# Patient Record
Sex: Male | Born: 1970 | Race: White | Hispanic: No | Marital: Single | Smoking: Current every day smoker
Health system: Southern US, Community
[De-identification: ages and names within clinical notes are randomized; demographics above are authoritative.]

---

## 2018-05-11 ENCOUNTER — Emergency Department: Payer: PRIVATE HEALTH INSURANCE

## 2018-05-11 ENCOUNTER — Encounter: Payer: Self-pay | Admitting: Emergency Medicine

## 2018-05-11 ENCOUNTER — Other Ambulatory Visit: Payer: Self-pay

## 2018-05-11 ENCOUNTER — Emergency Department
Admission: EM | Admit: 2018-05-11 | Discharge: 2018-05-11 | Disposition: A | Discharge status: Home / Self Care | Payer: PRIVATE HEALTH INSURANCE | Attending: Emergency Medicine | Admitting: Emergency Medicine

## 2018-05-11 DIAGNOSIS — S299XXA Unspecified injury of thorax, initial encounter: Secondary | ICD-10-CM | POA: Diagnosis present

## 2018-05-11 DIAGNOSIS — F17210 Nicotine dependence, cigarettes, uncomplicated: Secondary | ICD-10-CM | POA: Diagnosis not present

## 2018-05-11 DIAGNOSIS — S2241XA Multiple fractures of ribs, right side, initial encounter for closed fracture: Secondary | ICD-10-CM

## 2018-05-11 DIAGNOSIS — Y9389 Activity, other specified: Secondary | ICD-10-CM | POA: Insufficient documentation

## 2018-05-11 DIAGNOSIS — Y9259 Other trade areas as the place of occurrence of the external cause: Secondary | ICD-10-CM | POA: Insufficient documentation

## 2018-05-11 DIAGNOSIS — Y999 Unspecified external cause status: Secondary | ICD-10-CM | POA: Insufficient documentation

## 2018-05-11 DIAGNOSIS — W11XXXA Fall on and from ladder, initial encounter: Secondary | ICD-10-CM | POA: Insufficient documentation

## 2018-05-11 MED ORDER — NAPROXEN 500 MG PO TABS: 500.0000 mg | ORAL_TABLET | Freq: Two times a day (BID) | ORAL | 0 refills | Status: DC

## 2018-05-11 NOTE — ED Notes (Addendum)
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  Company not found in [employer].

## 2018-05-11 NOTE — ED Provider Notes (Signed)
[facility] Emergency Department Provider Note  ____________________________________________   First MD Initiated Contact with Patient 05/11/18 [phone]     (approximate)  I have reviewed the triage vital signs and the nursing notes.   HISTORY  Chief Complaint Rib Pain (Work Comp)   HPI James Petersen is a 47 y.o. male is here with complaint of right rib pain.  Patient states that he slipped while at work on a 6 foot ladder and landed on the concrete.  Patient states that he also injured his hand at that time but that has completely healed.  He continues to have pain in his right ribs that is not improved with over-the-counter medication.  This happened approximately 3 weeks ago.  This is the initial medical visit for this.  Patient is unsure if this will be counted as Workmen's Comp. even though it happened at work he has not seen his employer's since the accident.  He states that they are aware that this happened.  He denies any head injury or loss of consciousness.  He rates his pain as an 8 out of 10.   History reviewed. No pertinent past medical history.  There are no active problems to display for this patient.   History reviewed. No pertinent surgical history.  Prior to Admission medications   Medication Sig Start Date End Date Taking? Authorizing Provider  naproxen (NAPROSYN) 500 MG tablet Take 1 tablet (500 mg total) by mouth 2 (two) times daily with a meal. 05/11/18   Johnn Hai, PA-C    Allergies Patient has no known allergies.  No family history on file.  Social History Social History   Tobacco Use  . Smoking status: Current Every Day Smoker    Types: Cigarettes  . Smokeless tobacco: Never Used  Substance Use Topics  . Alcohol use: Not on file  . Drug use: Not on file    Review of Systems Constitutional: No fever/chills Cardiovascular: Denies chest pain. Respiratory: Denies shortness of breath. Gastrointestinal: No abdominal  pain.  No nausea, no vomiting. Musculoskeletal: Positive for right rib pain. Skin: Negative for rash. Neurological: Negative for headaches, focal weakness or numbness. ____________________________________________   PHYSICAL EXAM:  VITAL SIGNS: ED Triage Vitals  Enc Vitals Group     BP 05/11/18 0754 116/69     Pulse Rate 05/11/18 0754 70     Resp 05/11/18 0754 16     Temp 05/11/18 0754 98.3 F (36.8 C)     Temp Source 05/11/18 0754 Oral     SpO2 05/11/18 0754 99 %     Weight 05/11/18 0753 170 lb (77.1 kg)     Height 05/11/18 0753 5\' 10"  (1.778 m)     Head Circumference --      Peak Flow --      Pain Score 05/11/18 0752 8     Pain Loc --      Pain Edu? --      Excl. in [place]? --    Constitutional: Alert and oriented. Well appearing and in no acute distress. Eyes: Conjunctivae are normal.  Head: Atraumatic. Neck: No stridor.   Cardiovascular: Normal rate, regular rhythm. Grossly normal heart sounds.  Good peripheral circulation. Respiratory: Normal respiratory effort.  No retractions. Lungs CTAB.  No gross deformities noted of the ribs however there is marked tenderness on palpation of the right lateral aspect at approximately 7 and 8.  No soft tissue swelling or ecchymosis is present. Gastrointestinal: Soft and nontender. No distention.  No CVA tenderness. Musculoskeletal: Moves upper and lower extremities with any difficulty.  Normal gait was noted. Neurologic:  Normal speech and language. No gross focal neurologic deficits are appreciated.  Skin:  Skin is warm, dry and intact.  No ecchymosis or abrasions were seen. Psychiatric: Mood and affect are normal. Speech and behavior are normal.  ____________________________________________   LABS (all labs ordered are listed, but only abnormal results are displayed)  Labs Reviewed - No data to display   RADIOLOGY   Official radiology report(s): Dg Ribs Unilateral W/chest Right  Result Date: 05/11/2018 CLINICAL DATA:  Fall  from ladder 1 month ago with persistent right-sided chest pain, initial encounter EXAM: RIGHT RIBS AND CHEST - 3+ VIEW COMPARISON:  None. FINDINGS: Cardiac shadow is within normal limits. The lungs are well aerated bilaterally. No focal infiltrate or effusion is seen. No pneumothorax is noted. Some increased sclerosis is noted in the anterior aspect of the fifth through seventh ribs on the right consistent with healing fractures. A lateral eighth rib fracture with healing is noted as well. No other rib fractures are noted. IMPRESSION: Multiple healing rib fractures are noted on the right. No pneumothorax is noted. Electronically Signed   By: Inez Catalina M.D.   On: 05/11/2018 08:49  ____________________________________________   PROCEDURES  Procedure(s) performed: None  Procedures  Critical Care performed: No  ____________________________________________   INITIAL IMPRESSION / ASSESSMENT AND PLAN / ED COURSE  As part of my medical decision making, I reviewed the following data within the electronic MEDICAL RECORD NUMBER Notes from prior ED visits and [place] Controlled Substance Database  Patient was made aware that he does have rib fractures on the right.  These appear to be healing well per radiology report.  Patient was given a list of restrictions to take to work.  Patient was given naproxen 500 mg twice daily with food as this is already 25 weeks old.  He is to follow-up with [facility] acute care or Dr. of his companies choice.  ____________________________________________   FINAL CLINICAL IMPRESSION(S) / ED DIAGNOSES  Final diagnoses:  Fracture of ribs, three, closed, right, initial encounter     ED Discharge Orders        Ordered    naproxen (NAPROSYN) 500 MG tablet  2 times daily with meals,   Status:  Discontinued     05/11/18 0906    naproxen (NAPROSYN) 500 MG tablet  2 times daily with meals     05/11/18 0911       Note:  This document was prepared using Dragon voice  recognition software and may include unintentional dictation errors.    Johnn Hai, PA-C 05/11/18 1546    Delman Kitten, MD 05/11/18 [phone]

## 2018-05-11 NOTE — Discharge Instructions (Signed)
Follow-up with [facility] acute care or doctor  of your companies choice if any continued problems.  Begin taking naproxen 500 mg twice daily with food.  Decrease the amount of smoking that you are doing as this increases your chances for pneumonia with fractured ribs.

## 2018-05-11 NOTE — ED Triage Notes (Signed)
Slipped while on a 6 ft ladder, fell, landed on right hand and stretched / injured right ribs.  Patient states injury occurred 3 weeks ago and pain seems worse in the mornings.

## 2018-05-11 NOTE — ED Notes (Signed)
Pt c/o right side rib pain for the past few weeks related to work injury. Pt has not been seen for this injury before. Pt states the pain is worse in the mornings and has tried tylenol and ibuprofen with minimal relief.  Pt states the pain has not worsened but is not any better.

## 2018-06-13 ENCOUNTER — Emergency Department: Payer: Worker's Compensation

## 2018-06-13 ENCOUNTER — Encounter: Payer: Self-pay | Admitting: Emergency Medicine

## 2018-06-13 ENCOUNTER — Other Ambulatory Visit: Payer: Self-pay

## 2018-06-13 ENCOUNTER — Emergency Department
Admission: EM | Admit: 2018-06-13 | Discharge: 2018-06-13 | Disposition: A | Discharge status: Home / Self Care | Payer: Worker's Compensation | Attending: Emergency Medicine | Admitting: Emergency Medicine

## 2018-06-13 DIAGNOSIS — Y929 Unspecified place or not applicable: Secondary | ICD-10-CM | POA: Insufficient documentation

## 2018-06-13 DIAGNOSIS — D3501 Benign neoplasm of right adrenal gland: Secondary | ICD-10-CM | POA: Insufficient documentation

## 2018-06-13 DIAGNOSIS — W1789XA Other fall from one level to another, initial encounter: Secondary | ICD-10-CM | POA: Insufficient documentation

## 2018-06-13 DIAGNOSIS — Y990 Civilian activity done for income or pay: Secondary | ICD-10-CM | POA: Insufficient documentation

## 2018-06-13 DIAGNOSIS — R319 Hematuria, unspecified: Secondary | ICD-10-CM

## 2018-06-13 DIAGNOSIS — F17210 Nicotine dependence, cigarettes, uncomplicated: Secondary | ICD-10-CM | POA: Insufficient documentation

## 2018-06-13 DIAGNOSIS — Y939 Activity, unspecified: Secondary | ICD-10-CM | POA: Insufficient documentation

## 2018-06-13 DIAGNOSIS — D3502 Benign neoplasm of left adrenal gland: Secondary | ICD-10-CM

## 2018-06-13 DIAGNOSIS — S2231XA Fracture of one rib, right side, initial encounter for closed fracture: Secondary | ICD-10-CM | POA: Insufficient documentation

## 2018-06-13 LAB — CBC WITH DIFFERENTIAL/PLATELET
BASOS PCT: 1 %
Basophils Absolute: 0.1 10*3/uL (ref 0–0.1)
EOS ABS: 0.1 10*3/uL (ref 0–0.7)
EOS PCT: 2 %
HCT: 38.2 % — ABNORMAL LOW (ref 40.0–52.0)
HEMOGLOBIN: 13.6 g/dL (ref 13.0–18.0)
LYMPHS ABS: 1.5 10*3/uL (ref 1.0–3.6)
Lymphocytes Relative: 22 %
MCH: 33.3 pg (ref 26.0–34.0)
MCHC: 35.4 g/dL (ref 32.0–36.0)
MCV: 94 fL (ref 80.0–100.0)
MONO ABS: 0.7 10*3/uL (ref 0.2–1.0)
Monocytes Relative: 10 %
Neutro Abs: 4.4 10*3/uL (ref 1.4–6.5)
Neutrophils Relative %: 65 %
Platelets: 288 10*3/uL (ref 150–440)
RBC: 4.07 MIL/uL — ABNORMAL LOW (ref 4.40–5.90)
RDW: 13.3 % (ref 11.5–14.5)
WBC: 6.7 10*3/uL (ref 3.8–10.6)

## 2018-06-13 LAB — URINALYSIS, COMPLETE (UACMP) WITH MICROSCOPIC
Bacteria, UA: NONE SEEN
GLUCOSE, UA: NEGATIVE mg/dL
KETONES UR: 5 mg/dL — AB
LEUKOCYTES UA: NEGATIVE
NITRITE: NEGATIVE
PH: 5 (ref 5.0–8.0)
Protein, ur: 30 mg/dL — AB
SPECIFIC GRAVITY, URINE: 1.04 — AB (ref 1.005–1.030)
SQUAMOUS EPITHELIAL / LPF: NONE SEEN (ref 0–5)

## 2018-06-13 LAB — COMPREHENSIVE METABOLIC PANEL
ALBUMIN: 4.1 g/dL (ref 3.5–5.0)
ALK PHOS: 76 U/L (ref 38–126)
ALT: 23 U/L (ref 0–44)
ANION GAP: 5 (ref 5–15)
AST: 30 U/L (ref 15–41)
BILIRUBIN TOTAL: 0.6 mg/dL (ref 0.3–1.2)
BUN: 17 mg/dL (ref 6–20)
CALCIUM: 9 mg/dL (ref 8.9–10.3)
CO2: 25 mmol/L (ref 22–32)
Chloride: 107 mmol/L (ref 98–111)
Creatinine, Ser: 0.86 mg/dL (ref 0.61–1.24)
GFR calc Af Amer: >60 mL/min (ref >60)
GFR calc non Af Amer: >60 mL/min (ref >60)
Glucose, Bld: 94 mg/dL (ref 70–99)
POTASSIUM: 4.3 mmol/L (ref 3.5–5.1)
Sodium: 137 mmol/L (ref 135–145)
TOTAL PROTEIN: 6.8 g/dL (ref 6.5–8.1)

## 2018-06-13 MED ORDER — HYDROCODONE-ACETAMINOPHEN 5-325 MG PO TABS: 1.0000 | ORAL_TABLET | ORAL | 0 refills | Status: AC | PRN

## 2018-06-13 MED ORDER — IOPAMIDOL (ISOVUE-300) INJECTION 61%
100.0000 mL | Freq: Once | INTRAVENOUS | Status: AC | PRN
  Administered 2018-06-13: 100 mL via INTRAVENOUS
  Filled 2018-06-13: qty 100

## 2018-06-13 MED ORDER — HYDROCODONE-ACETAMINOPHEN 5-325 MG PO TABS
1.0000 | ORAL_TABLET | Freq: Once | ORAL | Status: AC
Start: 2018-06-13 — End: 2018-06-13
  Administered 2018-06-13: 1 via ORAL
  Filled 2018-06-13: qty 1

## 2018-06-13 NOTE — ED Notes (Signed)
Workman's comp urine drug screen completed by this tech and specimen bag taken to [facility] lab to await transport to [facility].

## 2018-06-13 NOTE — ED Provider Notes (Signed)
[facility] Emergency Department Provider Note  ____________________________________________   None    (approximate)  I have reviewed the triage vital signs and the nursing notes.   HISTORY  Chief Complaint Fall    HPI James Petersen is a 47 y.o. male presents to the ED with complaint of right lower rib cage pain.  Patient states that he fell off a scaffold approximately 8 feet and landed on his right side while at work 1 week ago.  He has taken "muscle relaxants belonging to mother" and Tylenol without any relief.  He states this morning that the pain increased.  He denies any head injury or loss of consciousness.  This is an initial visit for this injury as he has not seen any medical provider until today.  Patient also was seen on 05/11/2018 for fractured ribs that he sustained also at work from a fall.  Patient denies any known hematuria but states that his urine has been darker than usual.  He denies any nausea, vomiting, dizziness, headache or visual changes.  Currently rates his pain as a 10/10.  History reviewed. No pertinent past medical history.  There are no active problems to display for this patient.   History reviewed. No pertinent surgical history.  Prior to Admission medications   Medication Sig Start Date End Date Taking? Authorizing Provider  HYDROcodone-acetaminophen (NORCO/VICODIN) 5-325 MG tablet Take 1 tablet by mouth every 4 (four) hours as needed for moderate pain. 06/13/18   Johnn Hai, PA-C    Allergies Patient has no known allergies.  History reviewed. No pertinent family history.  Social History Social History   Tobacco Use  . Smoking status: Current Every Day Smoker    Packs/day: 1.00    Types: Cigarettes  . Smokeless tobacco: Never Used  Substance Use Topics  . Alcohol use: Not on file    Comment: occasional  . Drug use: Never    Review of Systems Constitutional: No fever/chills Eyes: No visual  changes. ENT: No trauma. Cardiovascular: Denies chest pain. Respiratory: Denies shortness of breath. Gastrointestinal: No abdominal pain.  No nausea, no vomiting.  No diarrhea.  No constipation. Genitourinary: Negative for dysuria.  Positive "dark urine". Musculoskeletal: Negative for cervical, thoracic or lumbar  pain.  Positive for right rib pain. Skin: Negative for rash. Neurological: Negative for headaches, focal weakness or numbness. ____________________________________________   PHYSICAL EXAM:  VITAL SIGNS: ED Triage Vitals  Enc Vitals Group     BP 06/13/18 0726 135/73     Pulse Rate 06/13/18 0726 95     Resp 06/13/18 0726 18     Temp 06/13/18 0726 98.5 F (36.9 C)     Temp Source 06/13/18 0726 Oral     SpO2 06/13/18 0726 98 %     Weight 06/13/18 0727 170 lb (77.1 kg)     Height 06/13/18 0727 5\' 10"  (1.778 m)     Head Circumference --      Peak Flow --      Pain Score 06/13/18 0726 10     Pain Loc --      Pain Edu? --      Excl. in [place]? --    Constitutional: Alert and oriented. Well appearing and in no acute distress. Eyes: Conjunctivae are normal. PERRL. EOMI. Head: Atraumatic. Nose: No trauma. Neck: No stridor.  No tenderness on palpation cervical spine posteriorly.  Range of motion is that restriction. Cardiovascular: Normal rate, regular rhythm. Grossly normal heart sounds.  Good  peripheral circulation. Respiratory: Normal respiratory effort.  No retractions.  Moderate tenderness on palpation of the right lateral ribs.  Lungs CTAB.  Minimal soft tissue swelling present.  No ecchymosis or abrasions were seen. Gastrointestinal: Soft and nontender. No distention.  No CVA tenderness.  Bowel sounds are normoactive x4 quadrants.   Musculoskeletal: No tenderness on palpation of the thoracic or lumbar spine.  Patient is able to move upper and lower extremities without any difficulty.  Good muscle strength bilaterally.  Patient is ambulatory without assistance. Neurologic:   Normal speech and language. No gross focal neurologic deficits are appreciated.  Reflexes 2+ bilaterally.  No gait instability. Skin:  Skin is warm, dry and intact. No rash noted. Psychiatric: Mood and affect are normal. Speech and behavior are normal.  ____________________________________________   LABS (all labs ordered are listed, but only abnormal results are displayed)  Labs Reviewed  URINALYSIS, COMPLETE (UACMP) WITH MICROSCOPIC - Abnormal; Notable for the following components:      Result Value   Color, Urine AMBER (*)    APPearance CLEAR (*)    Specific Gravity, Urine 1.040 (*)    Hgb urine dipstick MODERATE (*)    Bilirubin Urine SMALL (*)    Ketones, ur 5 (*)    Protein, ur 30 (*)    RBC / HPF >50 (*)    All other components within normal limits  CBC WITH DIFFERENTIAL/PLATELET - Abnormal; Notable for the following components:   RBC 4.07 (*)    HCT 38.2 (*)    All other components within normal limits  COMPREHENSIVE METABOLIC PANEL    RADIOLOGY  ED MD interpretation:   Right rib detail is positive for fracture acute and healing fractures as noted on radiologist report.  Official radiology report(s): Dg Ribs Unilateral W/chest Right  Result Date: 06/13/2018 CLINICAL DATA:  Pt states he fell from a scaffolding last Friday and hit his right rib area; States he has had a previous injury to 3 ribs to same side from August; States this pain is lower; pain is right anterior; BB marks area of interest EXAM: RIGHT RIBS AND CHEST - 3+ VIEW COMPARISON:  05/11/2018 FINDINGS: Healing right rib fractures as before, involving anterolateral aspect of ribs 5, 6, 7, 8. There is a fracture of the anterolateral aspect of right ninth rib without significant callus, suggesting this is more acute. No pneumothorax. No pleural effusion. IMPRESSION: 1. Right ninth rib fracture.  No pneumothorax. 2. Healing fractures of rib right ribs 5-8 as before. Electronically Signed   By: Lucrezia Europe M.D.   On:  06/13/2018 09:18   Ct Abdomen Pelvis W Contrast  Result Date: 06/13/2018 CLINICAL DATA:  Scaffolding injury 1 wk ago. Fall from about 8 feet. C/o pain to right upper abdomen area. Hx rt rib fx in July. States microscopic hematuria in ER today. Denies surgical hx. EXAM: CT ABDOMEN AND PELVIS WITH CONTRAST TECHNIQUE: Multidetector CT imaging of the abdomen and pelvis was performed using the standard protocol following bolus administration of intravenous contrast. CONTRAST:  170mL ISOVUE-300 IOPAMIDOL (ISOVUE-300) INJECTION 61% COMPARISON:  Rib radiographs, 05/11/2018 FINDINGS: Lower chest: Clear lung bases.  Heart normal in size. Hepatobiliary: Liver normal in size and attenuation. No mass or contusion. No laceration. Normal gallbladder. No bile duct dilation. Pancreas: No contusion or laceration.  No mass or inflammation. Spleen: No contusion or laceration. Normal in size and attenuation. No masses. Adrenals/Urinary Tract: Bilateral low-attenuation renal masses with significant relative washout consistent with adenomas, 19 mm on  the right and 16 mm on the left. More diffuse right adrenal gland thickening consistent with hyperplasia. No renal contusion or laceration. No renal masses, stones or hydronephrosis. Symmetric renal enhancement and excretion. Normal ureters. Normal bladder. Stomach/Bowel: No evidence of bowel injury. Stomach is unremarkable. Normal small bowel. Mild to moderate increased stool burden in the colon. No colonic wall thickening or inflammation. Normal appendix visualized. Vascular/Lymphatic: No significant vascular findings are present. No enlarged abdominal or pelvic lymph nodes. Reproductive: Unremarkable. Other: No abdominal wall contusion. No hernia. No ascites or hemoperitoneum. Musculoskeletal: Healing fractures of the lateral seventh and eighth right ribs. No acute fractures. No osteoblastic or osteolytic lesions. IMPRESSION: 1. No acute injury to the abdomen or pelvis. 2. Healing right  seventh and eighth rib fractures. No new fractures. 3. Mild to moderate increased stool burden in the colon. 4. 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 Electronically Signed   By: Lajean Manes M.D.   On: 06/13/2018 12:04  ____________________________________________   PROCEDURES  Procedure(s) performed: None  Procedures  Critical Care performed: No  ____________________________________________   INITIAL IMPRESSION / ASSESSMENT AND PLAN / ED COURSE  As part of my medical decision making, I reviewed the following data within the electronic MEDICAL RECORD NUMBER Notes from prior ED visits and [place] Controlled Substance Database  47 year old male presents to the emergency department with complaint of right rib pain after falling approximately 8 feet off a scalpel while at work 1 week ago.  Patient also has history of prior rib fractures on 05/11/2018.  Patient denies any head injury or loss of consciousness.  This is the initial visit for this accident.  X-rays revealed healing fractures of the seventh and eighth ribs along with what appears to be a new fracture of the ninth rib.  Incidental finding was bilateral adrenal adenomas and right adrenal hyperplasia.  Patient was given Norco while in the emergency department.  He was also given a prescription for the same as needed for pain.  He states his tetanus is up-to-date.  Patient is to follow-up with his companies doctor or [facility] acute care if any continued problems.  Workmen's Comp. papers were filed and patient complied with drug screen as documented by the company that he works for.  Patient is also was given a urologist to follow-up with for his adrenal gland adenoma which is not related to his Workmen's Comp. injury. ____________________________________________   FINAL CLINICAL IMPRESSION(S) / ED DIAGNOSES  Final diagnoses:  Closed fracture of one rib of right side, initial encounter  Hematuria, unspecified type    Fall from height of greater than 3 feet  Adenoma of left adrenal gland  Adenoma of right adrenal gland     ED Discharge Orders         Ordered    HYDROcodone-acetaminophen (NORCO/VICODIN) 5-325 MG tablet  Every 4 hours PRN     06/13/18 1237           Note:  This document was prepared using Dragon voice recognition software and may include unintentional dictation errors.    Johnn Hai, PA-C 06/13/18 1439    Lavonia Drafts, MD 06/13/18 [phone]

## 2018-06-13 NOTE — ED Triage Notes (Signed)
Pt presents via pov from home after falling last Friday from scaffolding; states he has had increasing pain since then in his lower right ribcage. Pt reports that he fell approximately 8 feet onto his right side. Pt states he had prior rib fracture in July but this pain is lower in the ribcage. Pt alert & oriented with nad noted.

## 2018-06-13 NOTE — Discharge Instructions (Signed)
Follow-up with [facility] or your companies Workmen's Comp. doctor for follow-up of your rib fracture.  Norco 1 every 4 hours as needed for moderate pain.  Take this medication with food.  Do not drive or operate machinery while taking this medication.  Ice to your ribs as needed for discomfort. You also need to follow-up with a urologist.  The urologist on call today is Dr. Gloriann Loan and his contact information is listed on your discharge papers.  This is in reference to your CT findings of an adrenal gland adenoma.  Avoid taking ibuprofen or anti-inflammatories at this time.  Return to the ED over the weekend if any severe worsening of your symptoms.

## 2018-06-13 NOTE — ED Notes (Signed)
See triage note  States he fell from a scaffolding last Friday  States he hit his right rib area  States he has had a previous injury to 3 ribs to same side  States this pain is lower down

## 2018-06-13 NOTE — ED Notes (Signed)
Pts supervisor's name is "Merrilee Seashore" and number is: [phone]. [employer] called and voicemail was left for supervisor.

## 2018-06-13 NOTE — ED Notes (Signed)
Pts supervisor returned call and requested urine drug screen. Supervisor reported that he will be sending someone to pick up employers copy of workman's comp chain of custody form.

## 2020-05-04 IMAGING — CR DG RIBS W/ CHEST 3+V*R*
1 series · 3 of 3 positions shown · non-contrast
Comparison: None.

CLINICAL DATA: Fall from ladder 1 month ago with persistent
right-sided chest pain, initial encounter

EXAM:
RIGHT RIBS AND CHEST - 3+ VIEW

[Series 1: dg ribs unilateral w/chest right · 0.14mm/px · 3 of 3 slices shown]
[im 1/3]
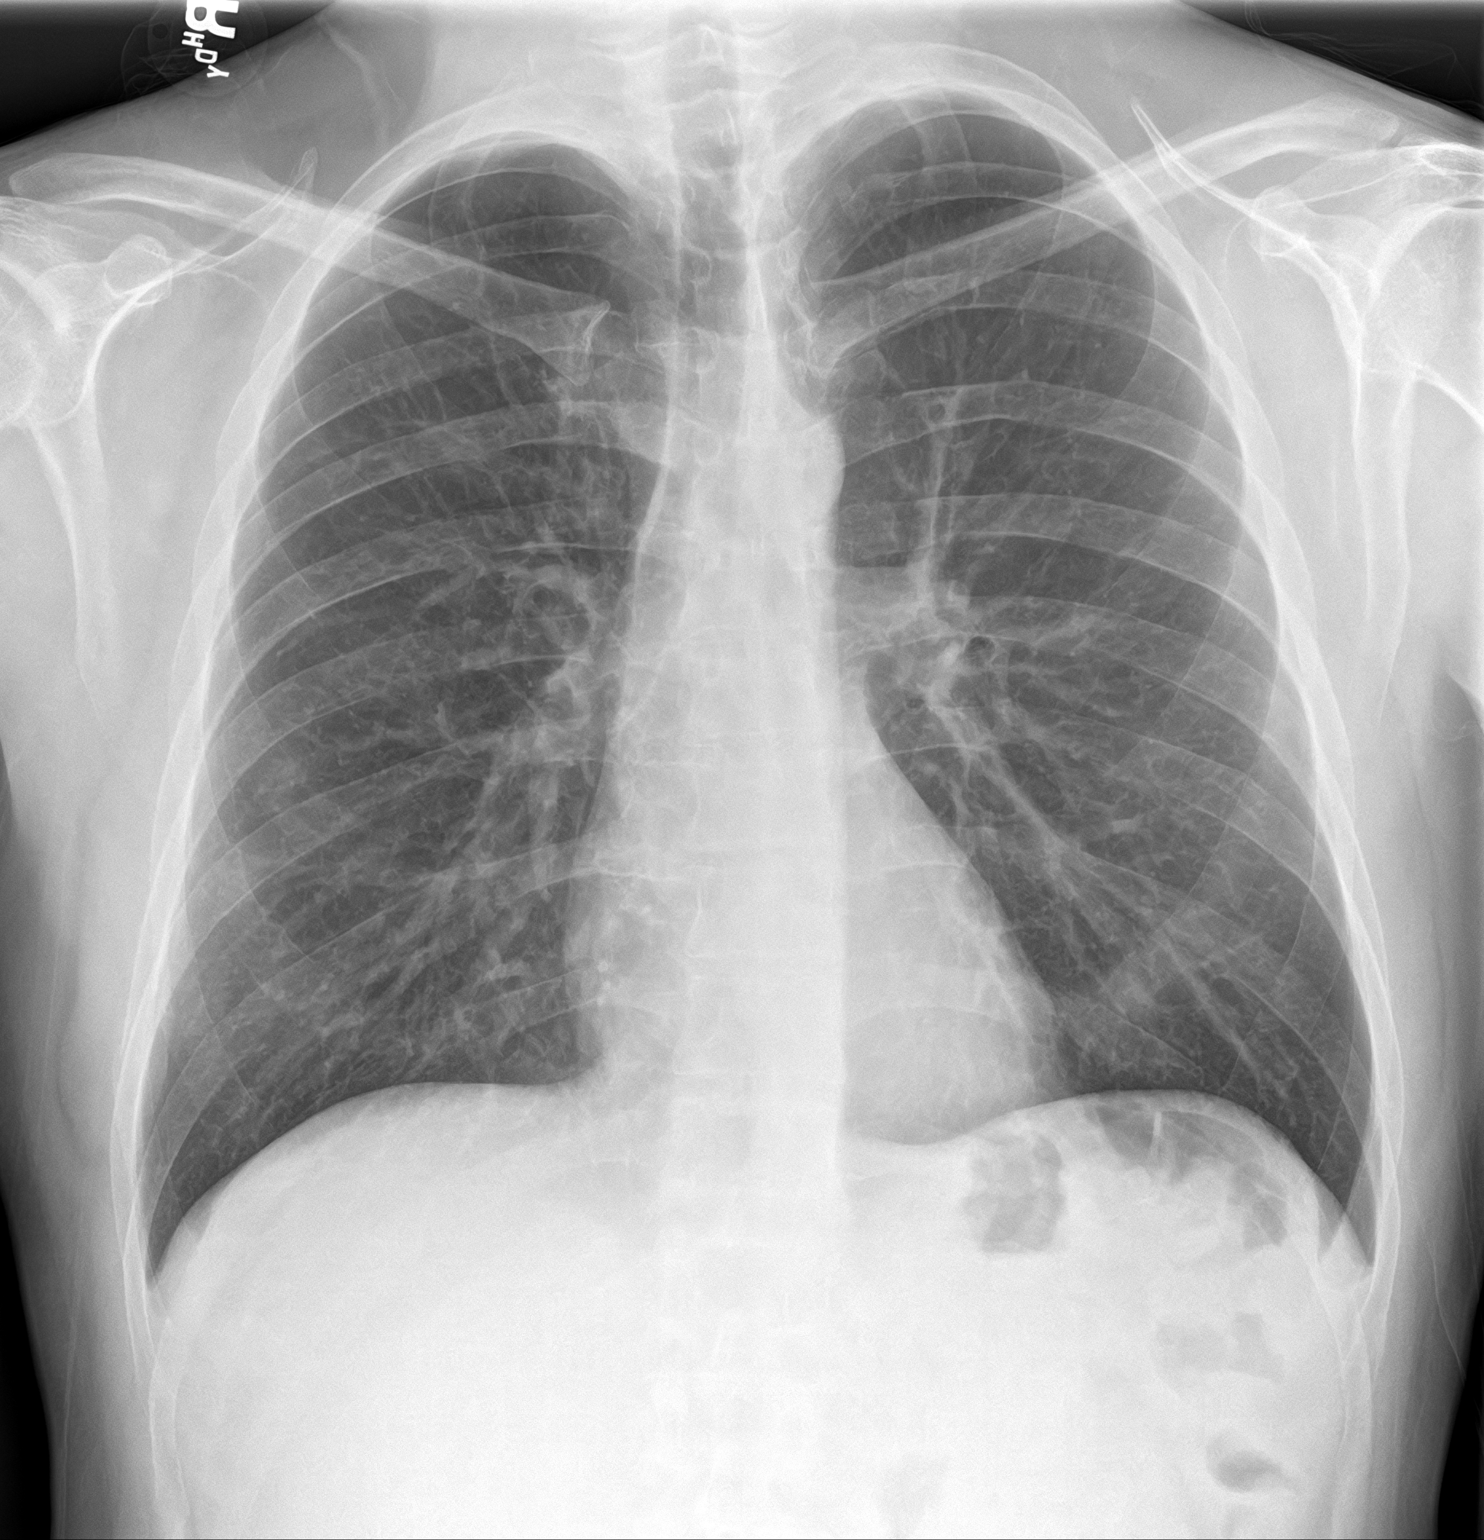
[im 2/3]
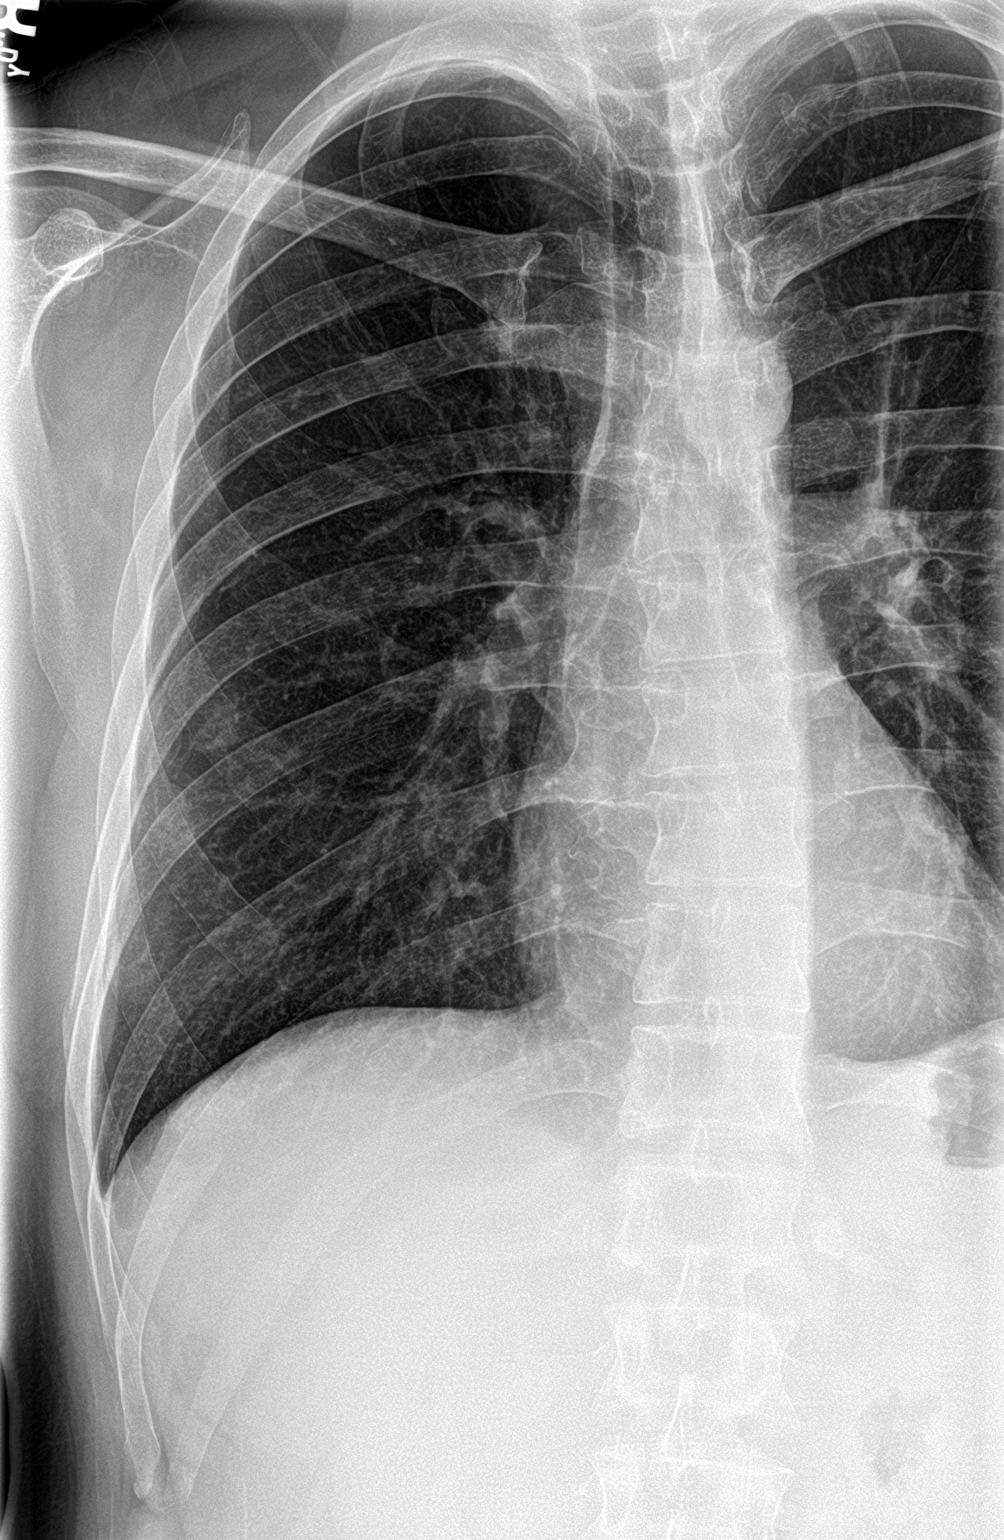
[im 3/3]
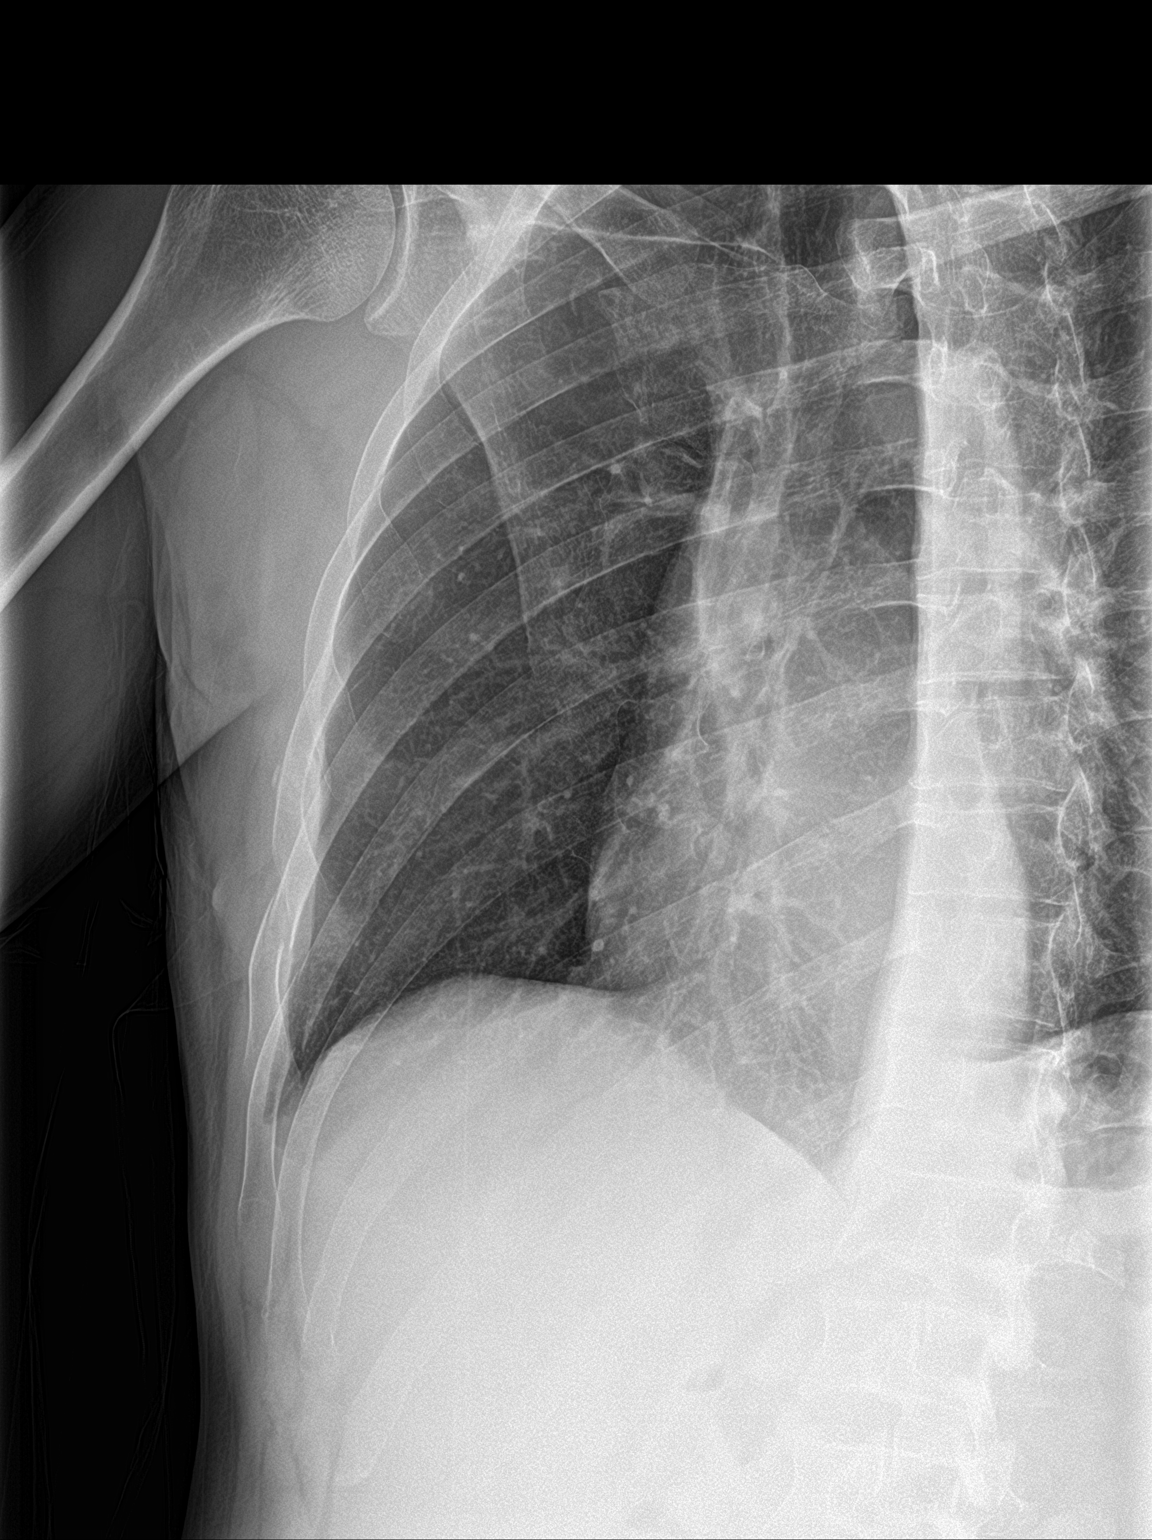

[3 of 3 positions shown; findings below may reference images not displayed]

FINDINGS: Cardiac shadow is within normal limits. The lungs are well aerated
bilaterally. No focal infiltrate or effusion is seen. No
pneumothorax is noted. Some increased sclerosis is noted in the
anterior aspect of the fifth through seventh ribs on the right
consistent with healing fractures. A lateral eighth rib fracture
with healing is noted as well. No other rib fractures are noted.
IMPRESSION: Multiple healing rib fractures are noted on the right. No
pneumothorax is noted.

## 2020-06-06 IMAGING — CR DG RIBS W/ CHEST 3+V*R*
5 series · 5 of 5 positions shown · non-contrast
Comparison: 05/11/2018

CLINICAL DATA: Pt states he fell from a scaffolding [REDACTED] and
hit his right rib area; States he has had a previous injury to 3
ribs to same side from [place]; States this pain is lower; pain is
right anterior; BB marks area of interest

EXAM:
RIGHT RIBS AND CHEST - 3+ VIEW

[chest pa]
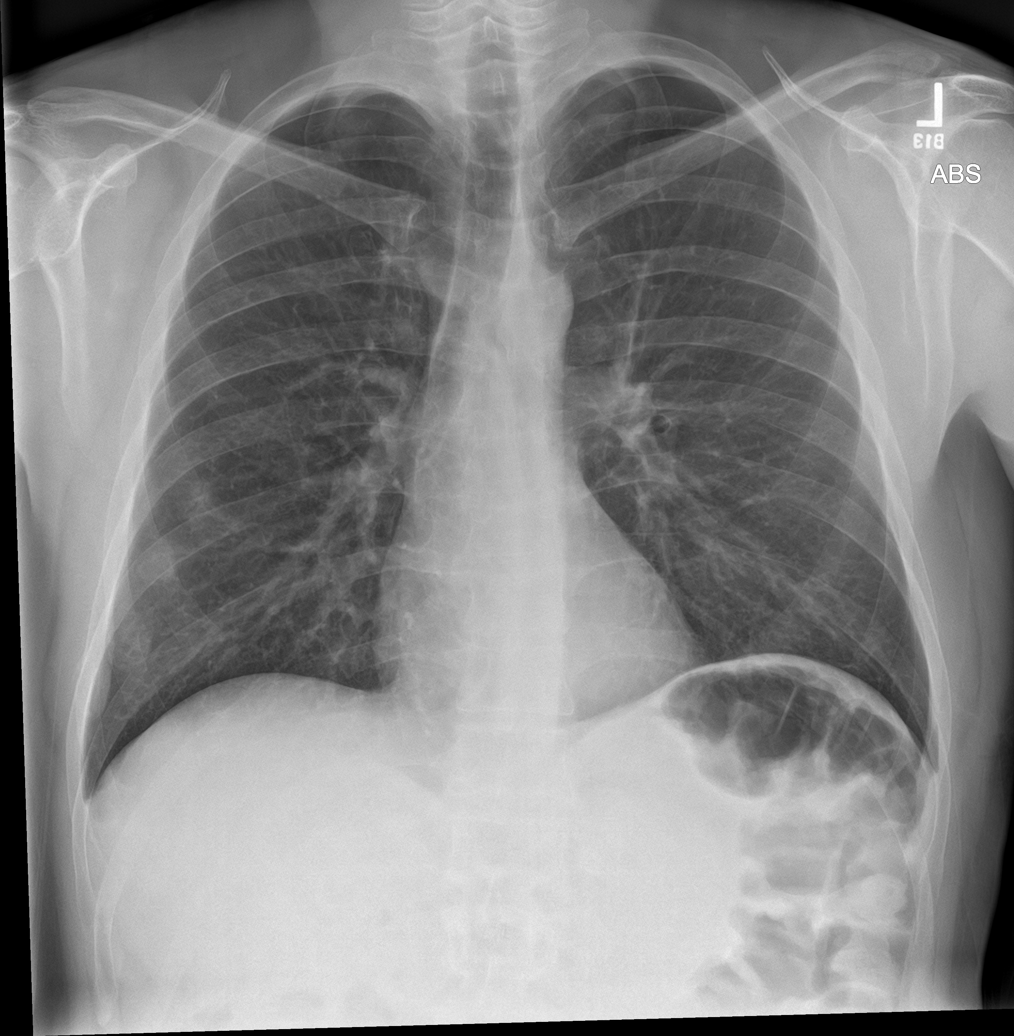

[rib pa (1 of 2)]
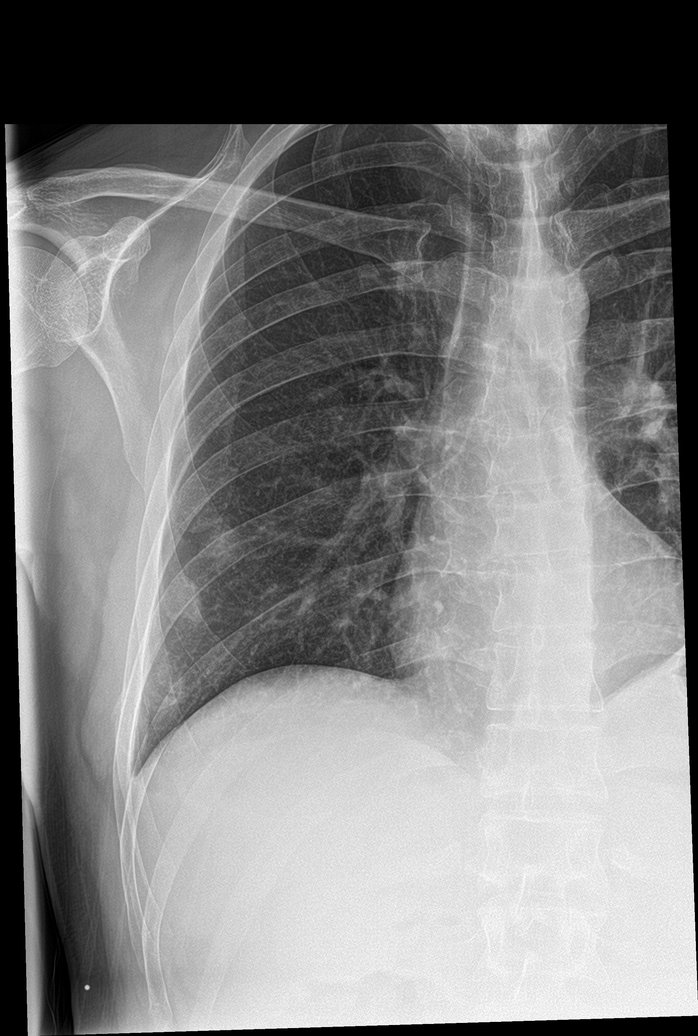

[rib pa (2 of 2)]
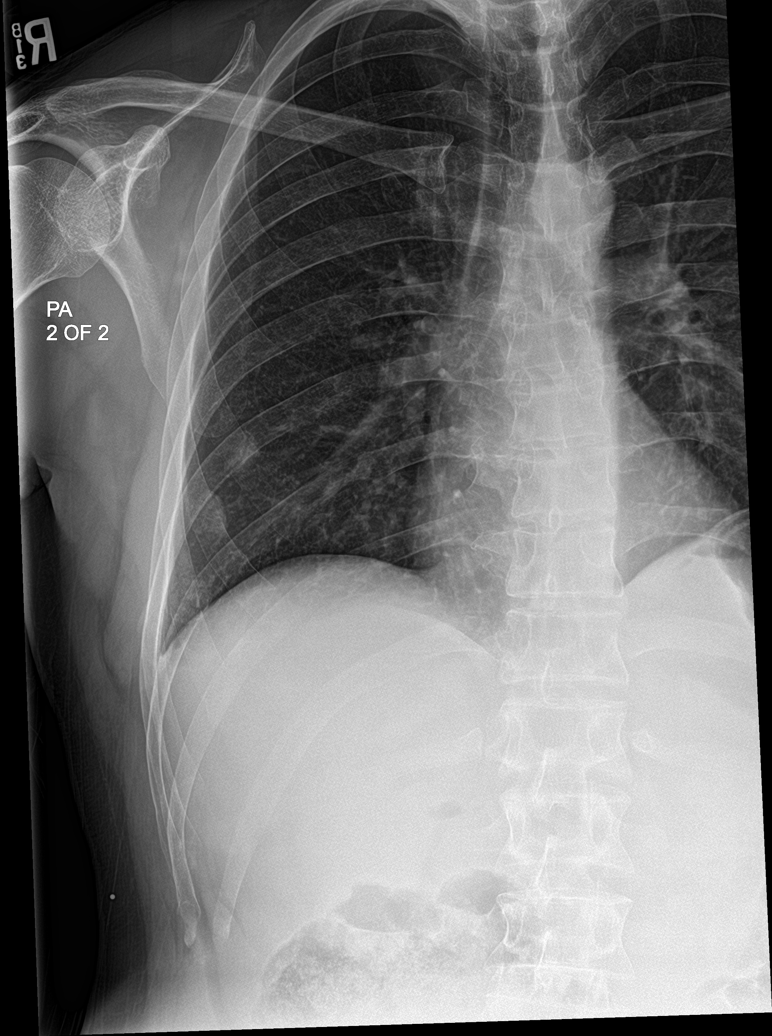

[rib pa obl (1 of 2)]
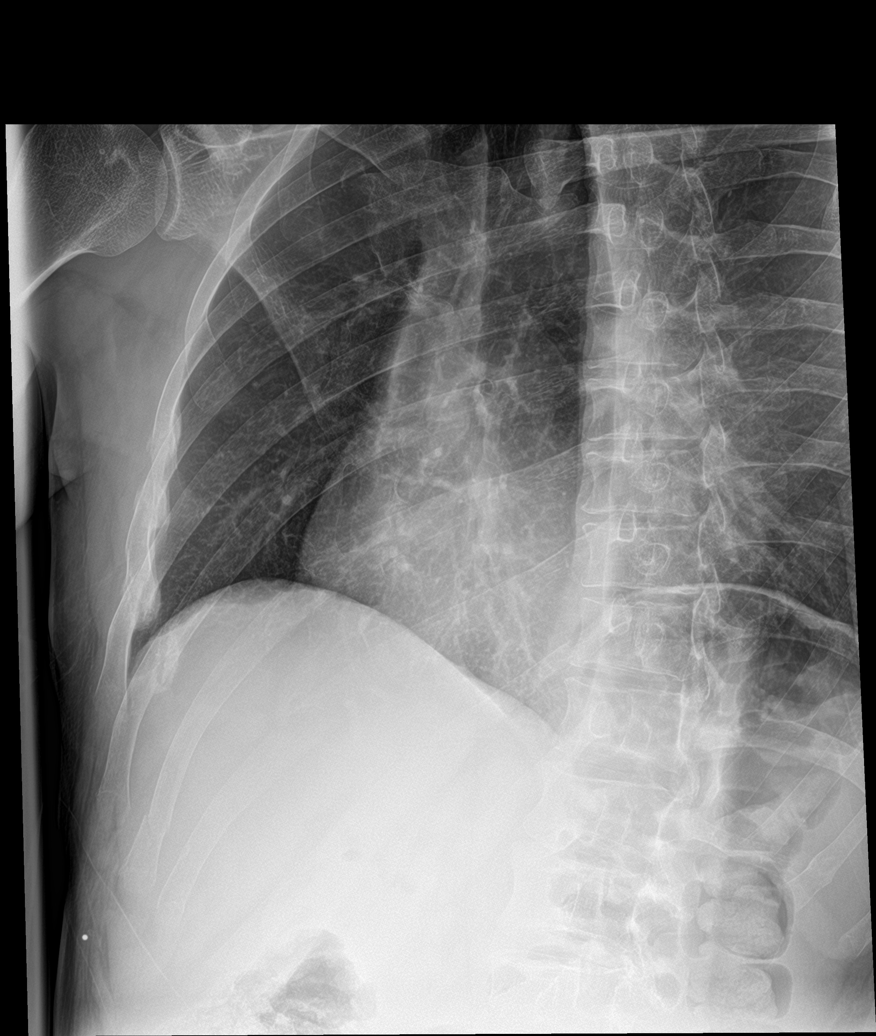

[rib pa obl (2 of 2)]
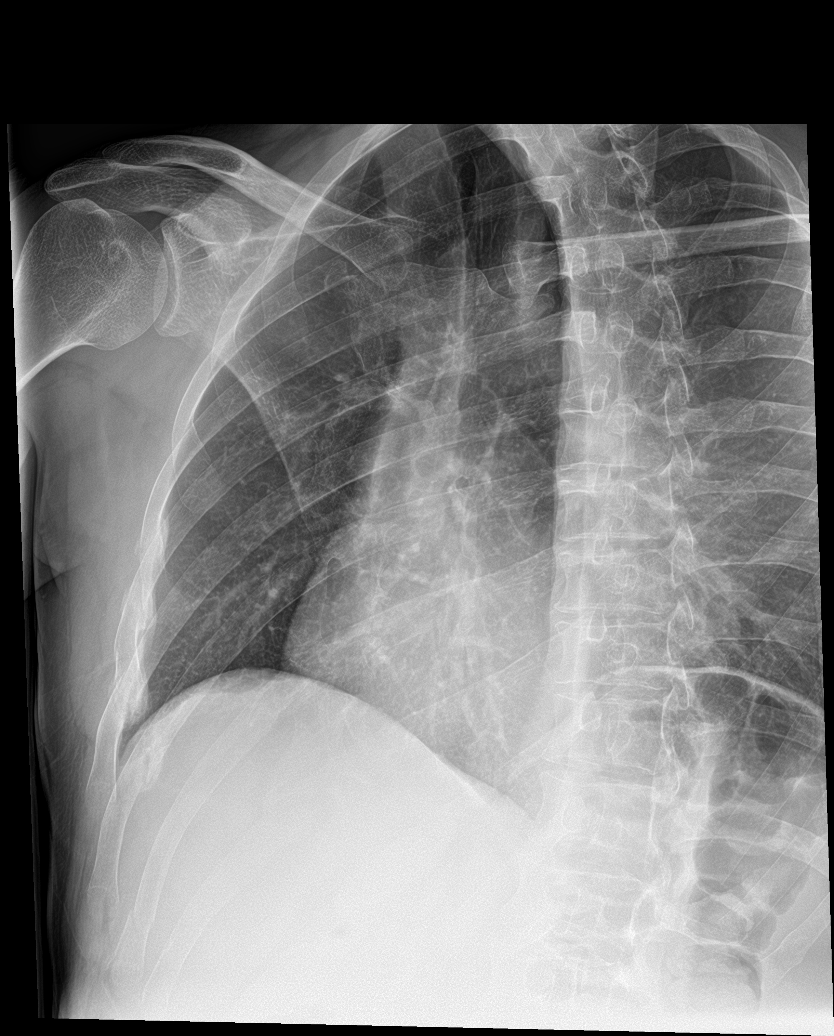

[5 of 5 positions shown; findings below may reference images not displayed]

FINDINGS: Healing right rib fractures as before, involving anterolateral
aspect of ribs 5, 6, 7, 8. There is a fracture of the anterolateral
aspect of right ninth rib without significant callus, suggesting
this is more acute. No pneumothorax. No pleural effusion.
IMPRESSION: 1. Right ninth rib fracture.  No pneumothorax.
2. Healing fractures of rib right ribs 5-8 as before.

## 2020-06-06 IMAGING — CT CT ABD-PELV W/ CM
2 of 5 series · 15 of 46 positions shown, 17 images · IV contrast (APPLIED)
Comparison: Rib radiographs, 05/11/2018

CLINICAL DATA: Scaffolding injury 1 wk ago. Fall from about 8 feet.
C/o pain to right upper abdomen area. Hx rt rib fx in [REDACTED]. States
microscopic hematuria in ER today. Denies surgical hx.

EXAM:
CT ABDOMEN AND PELVIS WITH CONTRAST
TECHNIQUE: Multidetector CT imaging of the abdomen and pelvis was performed
using the standard protocol following bolus administration of
intravenous contrast.
CONTRAST:  100mL 7ALDUI-622 IOPAMIDOL (7ALDUI-622) INJECTION 61%

[Series 2: axial st · axial · 0.70mm/px · z∈[+284,+709]mm · 12 of 97 slices shown, 14 images]
[im 6/97  soft-tissue]
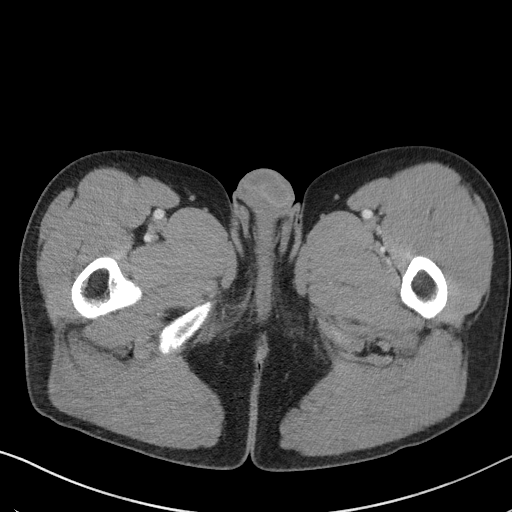
[im 6/97  bone]
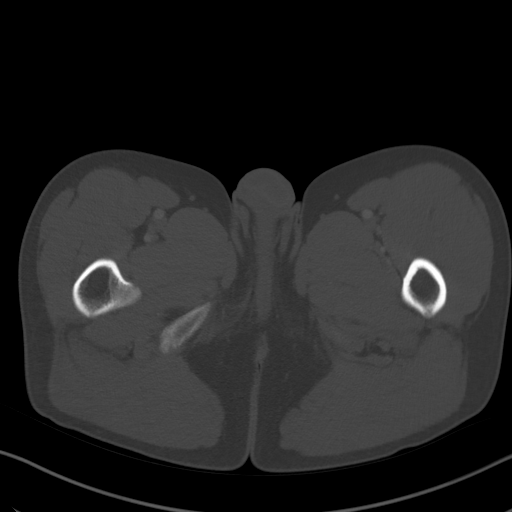
[im 17/97  soft-tissue]
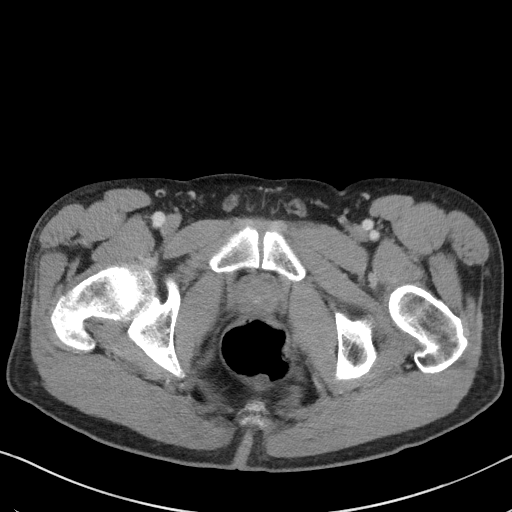
[im 22/97  soft-tissue]
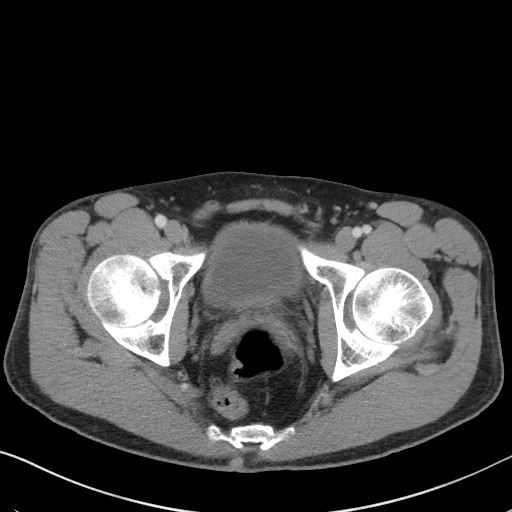
[im 27/97  soft-tissue]
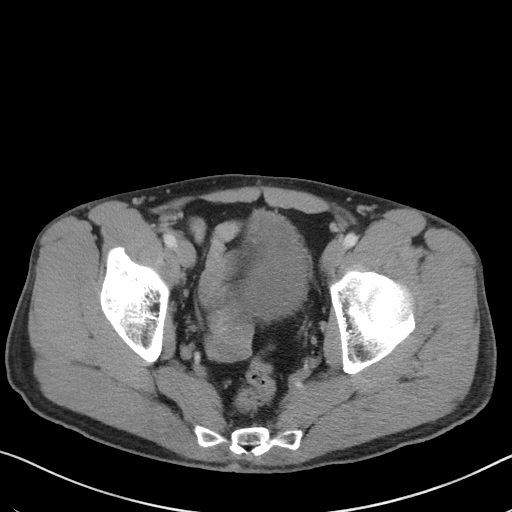
[im 38/97  soft-tissue]
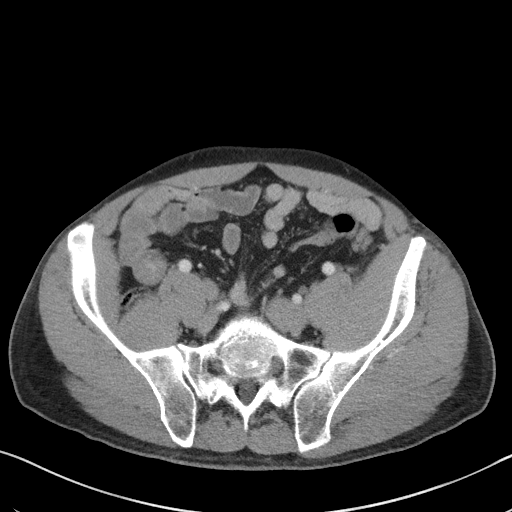
[im 43/97  soft-tissue]
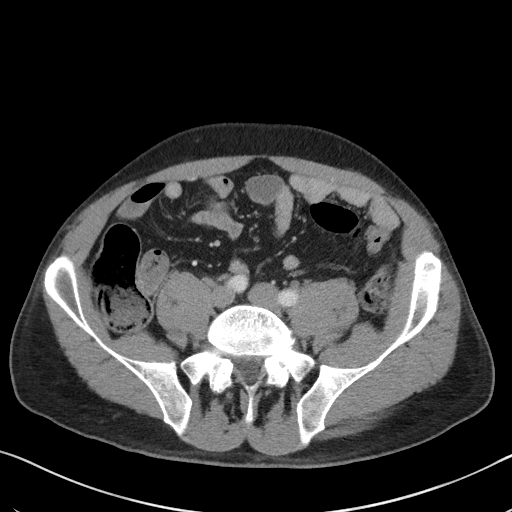
[im 54/97  soft-tissue]
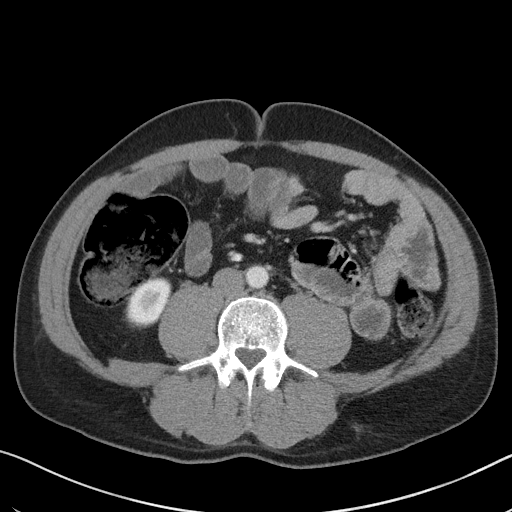
[im 59/97  soft-tissue]
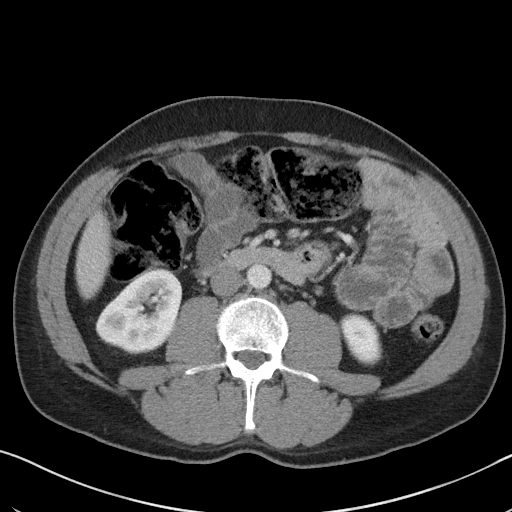
[im 70/97  soft-tissue]
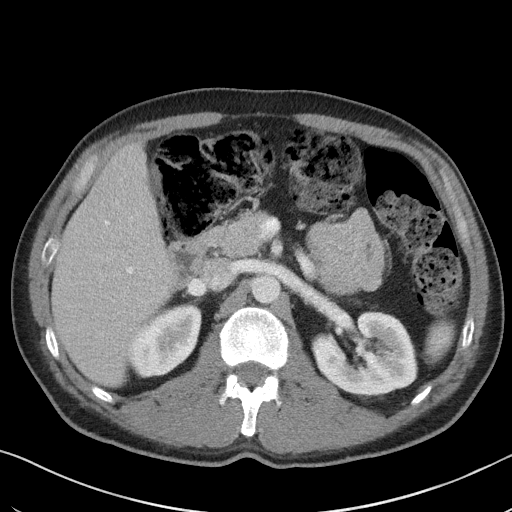
[im 70/97  bone]
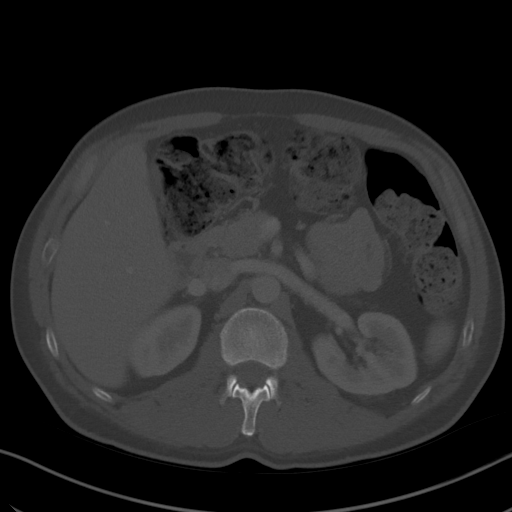
[im 75/97  soft-tissue]
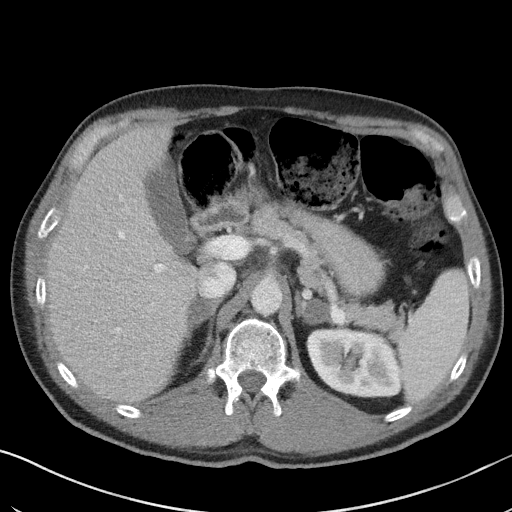
[im 81/97  soft-tissue]
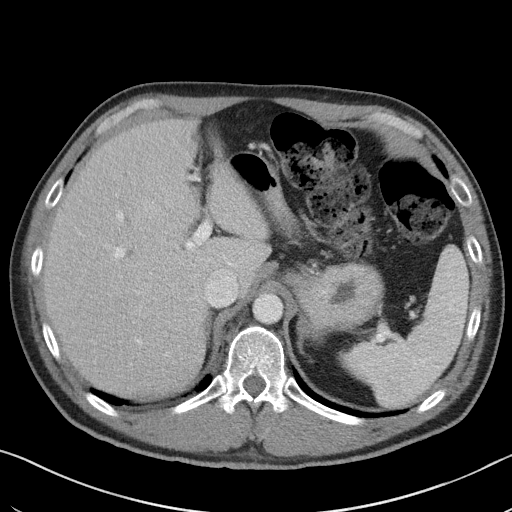
[im 91/97  soft-tissue]
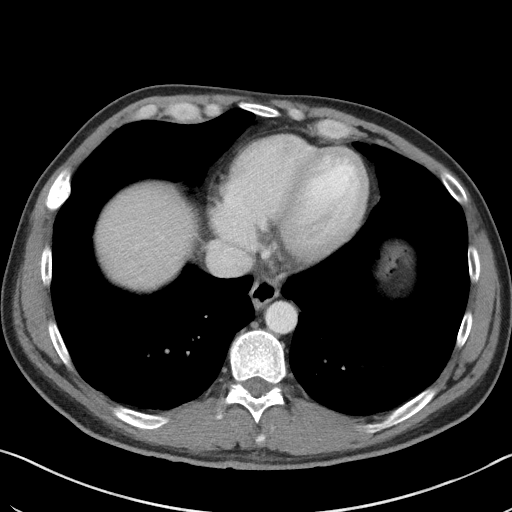

[Series 5: coronal st · coronal · 0.72mm/px · 3 of 83 slices shown]
[im 28/83  soft-tissue]
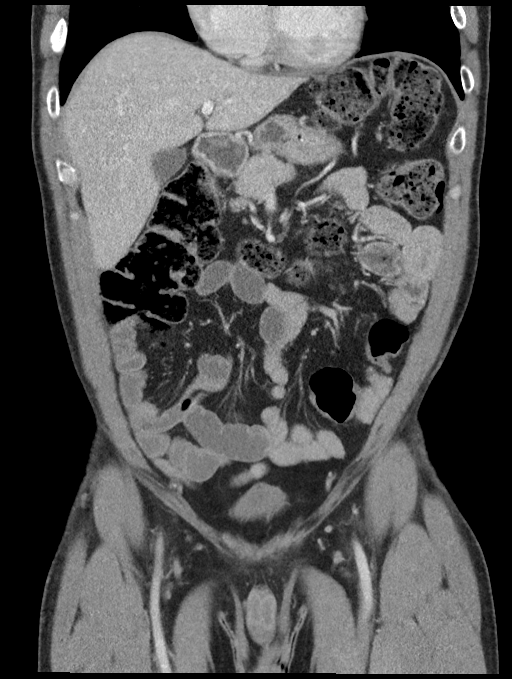
[im 37/83  soft-tissue]
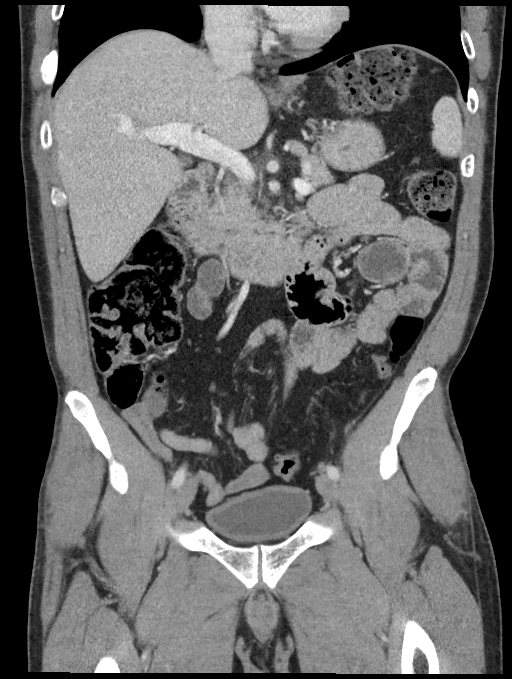
[im 46/83  soft-tissue]
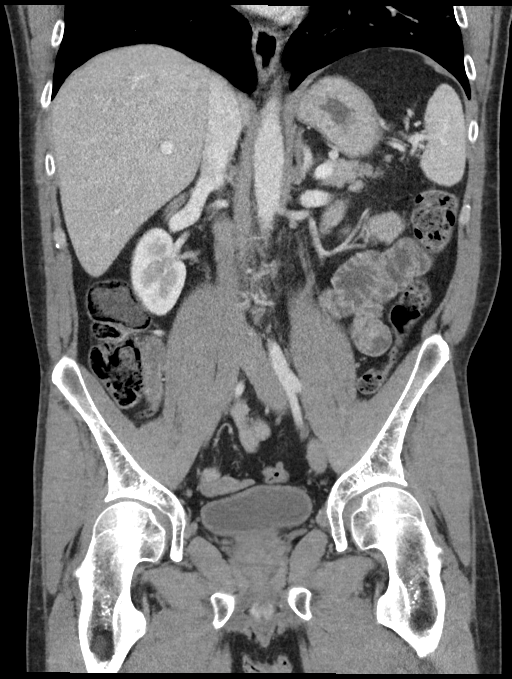

[15 of 46 positions shown; findings below may reference images not displayed]

FINDINGS: Lower chest: Clear lung bases.  Heart normal in size.

Hepatobiliary: Liver normal in size and attenuation. No mass or
contusion. No laceration. Normal gallbladder. No bile duct dilation.

Pancreas: No contusion or laceration.  No mass or inflammation.

Spleen: No contusion or laceration. Normal in size and attenuation.
No masses.

Adrenals/Urinary Tract: Bilateral low-attenuation renal masses with
significant relative washout consistent with adenomas, 19 mm on the
right and 16 mm on the left. More diffuse right adrenal gland
thickening consistent with hyperplasia.

No renal contusion or laceration. No renal masses, stones or
hydronephrosis. Symmetric renal enhancement and excretion. Normal
ureters. Normal bladder.

Stomach/Bowel: No evidence of bowel injury. Stomach is unremarkable.
Normal small bowel. Mild to moderate increased stool burden in the
colon. No colonic wall thickening or inflammation. Normal appendix
visualized.

Vascular/Lymphatic: No significant vascular findings are present. No
enlarged abdominal or pelvic lymph nodes.

Reproductive: Unremarkable.

Other: No abdominal wall contusion. No hernia. No ascites or
hemoperitoneum.

Musculoskeletal: Healing fractures of the lateral seventh and eighth
right ribs. No acute fractures. No osteoblastic or osteolytic
lesions.
IMPRESSION: 1. No acute injury to the abdomen or pelvis.
2. Healing right seventh and eighth rib fractures. No new fractures.
3. Mild to moderate increased stool burden in the colon.
4. Bilateral adrenal adenomas and mild right adrenal hyperplasia.
